# Patient Record
Sex: Male | Born: 1985 | Race: Black or African American | Hispanic: No | Marital: Single | Smoking: Current every day smoker
Health system: Southern US, Community
[De-identification: ages and names within clinical notes are randomized; demographics above are authoritative.]

---

## 2014-11-03 ENCOUNTER — Ambulatory Visit (INDEPENDENT_AMBULATORY_CARE_PROVIDER_SITE_OTHER): Payer: 59 | Admitting: Emergency Medicine

## 2014-11-03 VITALS — BP 112/80 | HR 55 | Temp 98.1°F | Resp 16 | Ht 70.0 in | Wt 142.2 lb

## 2014-11-03 DIAGNOSIS — J209 Acute bronchitis, unspecified: Secondary | ICD-10-CM

## 2014-11-03 DIAGNOSIS — J0140 Acute pansinusitis, unspecified: Secondary | ICD-10-CM

## 2014-11-03 MED ORDER — PSEUDOEPHEDRINE-GUAIFENESIN ER 60-600 MG PO TB12: 1.0000 | ORAL_TABLET | Freq: Two times a day (BID) | ORAL | Status: AC

## 2014-11-03 MED ORDER — PROMETHAZINE-CODEINE 6.25-10 MG/5ML PO SYRP: 5.0000 mL | ORAL_SOLUTION | Freq: Four times a day (QID) | ORAL | Status: DC | PRN

## 2014-11-03 MED ORDER — AMOXICILLIN-POT CLAVULANATE 875-125 MG PO TABS: 1.0000 | ORAL_TABLET | Freq: Two times a day (BID) | ORAL | Status: DC

## 2014-11-03 NOTE — Progress Notes (Signed)
[facility] [address], [place] [id number] [phone]  Date:  11/03/2014   Name:  Bruce MunroMohamed Almanza   DOB:  [birth year]   MRN:  [id number]  PCP:  No primary care provider on file.    Chief Complaint: Headache; Cough; and Sinusitis   History of Present Illness:  Bruce Flores is a 28 y.o. very pleasant male patient who presents with the following:  Ill for a week with nasal congestion and frontal headache.  Has no energy Has a cough productive of purulent sputum  No wheezing or shortness of breath No fever but chilled Sore throat No nausea or vomiting.  No stool change No improvement with over the counter medications or other home remedies.  Denies other complaint or health concern today.   There are no active problems to display for this patient.   History reviewed. No pertinent past medical history.  History reviewed. No pertinent past surgical history.  History  Substance Use Topics  . Smoking status: Current Every Day Smoker  . Smokeless tobacco: Not on file  . Alcohol Use: 0.0 oz/week    0 Not specified per week    History reviewed. No pertinent family history.  No Known Allergies  Medication list has been reviewed and updated.  No current outpatient prescriptions on file prior to visit.   No current facility-administered medications on file prior to visit.    Review of Systems:  As per HPI, otherwise negative.    Physical Examination: Filed Vitals:   11/03/14 1342  BP: 112/80  Pulse: 55  Temp: 98.1 F (36.7 C)  Resp: 16   Filed Vitals:   11/03/14 1342  Height: 5\' 10"  (1.778 m)  Weight: 142 lb 3.2 oz (64.501 kg)   Body mass index is 20.4 kg/(m^2). Ideal Body Weight: Weight in (lb) to have BMI = 25: 173.9  GEN: WDWN, NAD, Non-toxic, A & O x 3 HEENT: Atraumatic, Normocephalic. Neck supple. No masses, No LAD. Ears and Nose: No external deformity. CV: RRR, No M/G/R. No JVD. No thrill. No extra heart sounds. PULM:  CTA B, no wheezes, crackles, rhonchi. No retractions. No resp. distress. No accessory muscle use. ABD: S, NT, ND, +BS. No rebound. No HSM. EXTR: No c/c/e NEURO Normal gait.  PSYCH: Normally interactive. Conversant. Not depressed or anxious appearing.  Calm demeanor.    Assessment and Plan: Bronchitis Sinusitis  augmentin mucinex  Phen c cod  Signed,  Phillips OdorJeffery Bryttany Tortorelli, MD

## 2014-11-03 NOTE — Patient Instructions (Signed)
bAcute Bronchitis Bronchitis is inflammation of the airways that extend from the windpipe into the lungs (bronchi). The inflammation often causes mucus to develop. This leads to a cough, which is the most common symptom of bronchitis.  In acute bronchitis, the condition usually develops suddenly and goes away over time, usually in a couple weeks. Smoking, allergies, and asthma can make bronchitis worse. Repeated episodes of bronchitis may cause further lung problems.  CAUSES Acute bronchitis is most often caused by the same virus that causes a cold. The virus can spread from person to person (contagious) through coughing, sneezing, and touching contaminated objects. SIGNS AND SYMPTOMS   Cough.   Fever.   Coughing up mucus.   Body aches.   Chest congestion.   Chills.   Shortness of breath.   Sore throat.  DIAGNOSIS  Acute bronchitis is usually diagnosed through a physical exam. Your health care provider will also ask you questions about your medical history. Tests, such as chest X-rays, are sometimes done to rule out other conditions.  TREATMENT  Acute bronchitis usually goes away in a couple weeks. Oftentimes, no medical treatment is necessary. Medicines are sometimes given for relief of fever or cough. Antibiotic medicines are usually not needed but may be prescribed in certain situations. In some cases, an inhaler may be recommended to help reduce shortness of breath and control the cough. A cool mist vaporizer may also be used to help thin bronchial secretions and make it easier to clear the chest.  HOME CARE INSTRUCTIONS  Get plenty of rest.   Drink enough fluids to keep your urine clear or pale yellow (unless you have a medical condition that requires fluid restriction). Increasing fluids may help thin your respiratory secretions (sputum) and reduce chest congestion, and it will prevent dehydration.   Take medicines only as directed by your health care provider.  If  you were prescribed an antibiotic medicine, finish it all even if you start to feel better.  Avoid smoking and secondhand smoke. Exposure to cigarette smoke or irritating chemicals will make bronchitis worse. If you are a smoker, consider using nicotine gum or skin patches to help control withdrawal symptoms. Quitting smoking will help your lungs heal faster.   Reduce the chances of another bout of acute bronchitis by washing your hands frequently, avoiding people with cold symptoms, and trying not to touch your hands to your mouth, nose, or eyes.   Keep all follow-up visits as directed by your health care provider.  SEEK MEDICAL CARE IF: Your symptoms do not improve after 1 week of treatment.  SEEK IMMEDIATE MEDICAL CARE IF:  You develop an increased fever or chills.   You have chest pain.   You have severe shortness of breath.  You have bloody sputum.   You develop dehydration.  You faint or repeatedly feel like you are going to pass out.  You develop repeated vomiting.  You develop a severe headache. MAKE SURE YOU:   Understand these instructions.  Will watch your condition.  Will get help right away if you are not doing well or get worse. Document Released: 12/19/2004 Document Revised: 03/28/2014 Document Reviewed: 05/04/2013 [facility] Patient Information 2015 [place]. This information is not intended to replace advice given to you by your health care provider. Make sure you discuss any questions you have with your health care provider. Sinusitis Sinusitis is redness, soreness, and inflammation of the paranasal sinuses. Paranasal sinuses are air pockets within the bones of your face (beneath the  eyes, the middle of the forehead, or above the eyes). In healthy paranasal sinuses, mucus is able to drain out, and air is able to circulate through them by way of your nose. However, when your paranasal sinuses are inflamed, mucus and air can become trapped. This can  allow bacteria and other germs to grow and cause infection. Sinusitis can develop quickly and last only a short time (acute) or continue over a long period (chronic). Sinusitis that lasts for more than 12 weeks is considered chronic.  CAUSES  Causes of sinusitis include:  Allergies.  Structural abnormalities, such as displacement of the cartilage that separates your nostrils (deviated septum), which can decrease the air flow through your nose and sinuses and affect sinus drainage.  Functional abnormalities, such as when the small hairs (cilia) that line your sinuses and help remove mucus do not work properly or are not present. SIGNS AND SYMPTOMS  Symptoms of acute and chronic sinusitis are the same. The primary symptoms are pain and pressure around the affected sinuses. Other symptoms include:  Upper toothache.  Earache.  Headache.  Bad breath.  Decreased sense of smell and taste.  A cough, which worsens when you are lying flat.  Fatigue.  Fever.  Thick drainage from your nose, which often is green and may contain pus (purulent).  Swelling and warmth over the affected sinuses. DIAGNOSIS  Your health care provider will perform a physical exam. During the exam, your health care provider may:  Look in your nose for signs of abnormal growths in your nostrils (nasal polyps).  Tap over the affected sinus to check for signs of infection.  View the inside of your sinuses (endoscopy) using an imaging device that has a light attached (endoscope). If your health care provider suspects that you have chronic sinusitis, one or more of the following tests may be recommended:  Allergy tests.  Nasal culture. A sample of mucus is taken from your nose, sent to a lab, and screened for bacteria.  Nasal cytology. A sample of mucus is taken from your nose and examined by your health care provider to determine if your sinusitis is related to an allergy. TREATMENT  Most cases of acute  sinusitis are related to a viral infection and will resolve on their own within 10 days. Sometimes medicines are prescribed to help relieve symptoms (pain medicine, decongestants, nasal steroid sprays, or saline sprays).  However, for sinusitis related to a bacterial infection, your health care provider will prescribe antibiotic medicines. These are medicines that will help kill the bacteria causing the infection.  Rarely, sinusitis is caused by a fungal infection. In theses cases, your health care provider will prescribe antifungal medicine. For some cases of chronic sinusitis, surgery is needed. Generally, these are cases in which sinusitis recurs more than 3 times per year, despite other treatments. HOME CARE INSTRUCTIONS   Drink plenty of water. Water helps thin the mucus so your sinuses can drain more easily.  Use a humidifier.  Inhale steam 3 to 4 times a day (for example, sit in the bathroom with the shower running).  Apply a warm, moist washcloth to your face 3 to 4 times a day, or as directed by your health care provider.  Use saline nasal sprays to help moisten and clean your sinuses.  Take medicines only as directed by your health care provider.  If you were prescribed either an antibiotic or antifungal medicine, finish it all even if you start to feel better. SEEK IMMEDIATE MEDICAL   CARE IF:  You have increasing pain or severe headaches.  You have nausea, vomiting, or drowsiness.  You have swelling around your face.  You have vision problems.  You have a stiff neck.  You have difficulty breathing. MAKE SURE YOU:   Understand these instructions.  Will watch your condition.  Will get help right away if you are not doing well or get worse. Document Released: 11/11/2005 Document Revised: 03/28/2014 Document Reviewed: 11/26/2011 [employer] Patient Information 2015 [employer]. This information is not intended to replace advice given to you by your health care provider.  Make sure you discuss any questions you have with your health care provider.  

## 2017-07-09 ENCOUNTER — Encounter (HOSPITAL_COMMUNITY): Payer: Self-pay | Admitting: Emergency Medicine

## 2017-07-09 ENCOUNTER — Emergency Department (HOSPITAL_COMMUNITY)
Admission: EM | Admit: 2017-07-09 | Discharge: 2017-07-09 | Disposition: A | Discharge status: Home / Self Care | Payer: BLUE CROSS/BLUE SHIELD | Attending: Emergency Medicine | Admitting: Emergency Medicine

## 2017-07-09 DIAGNOSIS — F17200 Nicotine dependence, unspecified, uncomplicated: Secondary | ICD-10-CM | POA: Insufficient documentation

## 2017-07-09 DIAGNOSIS — Z79899 Other long term (current) drug therapy: Secondary | ICD-10-CM | POA: Insufficient documentation

## 2017-07-09 DIAGNOSIS — R369 Urethral discharge, unspecified: Secondary | ICD-10-CM | POA: Insufficient documentation

## 2017-07-09 LAB — URINALYSIS, ROUTINE W REFLEX MICROSCOPIC
BILIRUBIN URINE: NEGATIVE
GLUCOSE, UA: NEGATIVE mg/dL
Ketones, ur: NEGATIVE mg/dL
NITRITE: NEGATIVE
Protein, ur: NEGATIVE mg/dL
RBC / HPF: NONE SEEN RBC/hpf (ref 0–5)
SPECIFIC GRAVITY, URINE: 1.004 — AB (ref 1.005–1.030)
pH: 6 (ref 5.0–8.0)

## 2017-07-09 LAB — GC/CHLAMYDIA PROBE AMP (~~LOC~~) NOT AT ARMC
CHLAMYDIA, DNA PROBE: NEGATIVE
Neisseria Gonorrhea: NEGATIVE

## 2017-07-09 MED ORDER — AZITHROMYCIN 250 MG PO TABS
1000.0000 mg | ORAL_TABLET | Freq: Once | ORAL | Status: AC
  Administered 2017-07-09: 1000 mg via ORAL
  Filled 2017-07-09: qty 4

## 2017-07-09 MED ORDER — CEFTRIAXONE SODIUM 250 MG IJ SOLR
250.0000 mg | Freq: Once | INTRAMUSCULAR | Status: AC
  Administered 2017-07-09: 250 mg via INTRAMUSCULAR
  Filled 2017-07-09: qty 250

## 2017-07-09 MED ORDER — LIDOCAINE HCL (PF) 1 % IJ SOLN
INTRAMUSCULAR | Status: AC
  Filled 2017-07-09: qty 5

## 2017-07-09 MED ORDER — LIDOCAINE HCL (PF) 1 % IJ SOLN
0.9000 mL | Freq: Once | INTRAMUSCULAR | Status: AC
  Administered 2017-07-09: 0.9 mL

## 2017-07-09 NOTE — ED Triage Notes (Signed)
Patient reports penile discharge with mild dysuria onset last week , denies fever or chills .

## 2017-07-09 NOTE — ED Provider Notes (Signed)
[facility]-EMERGENCY DEPT Provider Note   CSN: [id number] Arrival date & time: 07/09/17  0040     History   Chief Complaint Chief Complaint  Patient presents with  . Penile Discharge    HPI Bruce Flores is a 31 y.o. male.  Patient presents to the ED with a chief complaint of penile discharge.  He states that the symptoms started 1 week ago.  He states that he has had some yellow discharge, but minimal dysuria.  States that he has multiple partners, but always uses a condom.  He has tried treating himself with garlic and ginger.  He denies any testicle or abdominal pain.   The history is provided by the patient. No language interpreter was used.    History reviewed. No pertinent past medical history.  There are no active problems to display for this patient.   History reviewed. No pertinent surgical history.     Home Medications    Prior to Admission medications   Medication Sig Start Date End Date Taking? Authorizing Provider  amoxicillin-clavulanate (AUGMENTIN) 875-125 MG per tablet Take 1 tablet by mouth 2 (two) times daily. 11/03/14   Carmelina Dane, MD  promethazine-codeine [facility] WITH CODEINE) 6.25-10 MG/5ML syrup Take 5-10 mLs by mouth every 6 (six) hours as needed. 11/03/14   Carmelina Dane, MD    Family History No family history on file.  Social History Social History  Substance Use Topics  . Smoking status: Current Every Day Smoker  . Smokeless tobacco: Never Used  . Alcohol use 0.0 oz/week     Allergies   Patient has no known allergies.   Review of Systems Review of Systems  Constitutional: Negative for chills and fever.  Respiratory: Negative for shortness of breath.   Cardiovascular: Negative for chest pain.  Gastrointestinal: Negative for constipation, diarrhea, nausea and vomiting.  Genitourinary: Positive for discharge. Negative for dysuria.     Physical Exam Updated Vital Signs BP (!) 128/98 (BP Location: Left Arm)    Pulse 61   Temp 98.4 F (36.9 C) (Oral)   Resp 16   SpO2 100%   Physical Exam  Constitutional: He is oriented to person, place, and time. He appears well-developed and well-nourished.  HENT:  Head: Normocephalic and atraumatic.  Eyes: Conjunctivae and EOM are normal.  Neck: Normal range of motion.  Cardiovascular: Normal rate.   Pulmonary/Chest: Effort normal.  Abdominal: He exhibits no distension.  Genitourinary:  Genitourinary Comments: Circumcised Minimal discharge No testicle tenderness  Musculoskeletal: Normal range of motion.  Neurological: He is alert and oriented to person, place, and time.  Skin: Skin is dry.  Psychiatric: He has a normal mood and affect. His behavior is normal. Judgment and thought content normal.  Nursing note and vitals reviewed.    ED Treatments / Results  Labs (all labs ordered are listed, but only abnormal results are displayed) Labs Reviewed  URINALYSIS, ROUTINE W REFLEX MICROSCOPIC - Abnormal; Notable for the following:       Result Value   Color, Urine COLORLESS (*)    Specific Gravity, Urine 1.004 (*)    Hgb urine dipstick SMALL (*)    Leukocytes, UA LARGE (*)    Bacteria, UA RARE (*)    Squamous Epithelial / LPF 0-5 (*)    All other components within normal limits  GC/CHLAMYDIA PROBE AMP ([place]) NOT AT [facility]    EKG  EKG Interpretation None       Radiology No results found.  Procedures Procedures (including  critical care time)  Medications Ordered in ED Medications  cefTRIAXone (ROCEPHIN) injection 250 mg (not administered)  azithromycin (ZITHROMAX) tablet 1,000 mg (not administered)     Initial Impression / Assessment and Plan / ED Course  I have reviewed the triage vital signs and the nursing notes.  Pertinent labs & imaging results that were available during my care of the patient were reviewed by me and considered in my medical decision making (see chart for details).       Final Clinical  Impressions(s) / ED Diagnoses   Final diagnoses:  Penile discharge    New Prescriptions New Prescriptions   No medications on file     Roxy HorsemanBrowning, Taite Baldassari, Cordelia Poche-C 07/09/17 [phone]    Zadie RhineWickline, Donald, MD 07/09/17 [phone]

## 2018-03-05 ENCOUNTER — Encounter (HOSPITAL_COMMUNITY): Payer: Self-pay | Admitting: Emergency Medicine

## 2018-03-05 ENCOUNTER — Emergency Department (HOSPITAL_COMMUNITY)
Admission: EM | Admit: 2018-03-05 | Discharge: 2018-03-06 | Disposition: A | Discharge status: Home / Self Care | Payer: BLUE CROSS/BLUE SHIELD | Attending: Emergency Medicine | Admitting: Emergency Medicine

## 2018-03-05 ENCOUNTER — Other Ambulatory Visit: Payer: Self-pay

## 2018-03-05 DIAGNOSIS — Z79899 Other long term (current) drug therapy: Secondary | ICD-10-CM | POA: Diagnosis not present

## 2018-03-05 DIAGNOSIS — R519 Headache, unspecified: Secondary | ICD-10-CM

## 2018-03-05 DIAGNOSIS — F17210 Nicotine dependence, cigarettes, uncomplicated: Secondary | ICD-10-CM | POA: Insufficient documentation

## 2018-03-05 DIAGNOSIS — R109 Unspecified abdominal pain: Secondary | ICD-10-CM | POA: Insufficient documentation

## 2018-03-05 DIAGNOSIS — R51 Headache: Secondary | ICD-10-CM | POA: Diagnosis not present

## 2018-03-05 LAB — COMPREHENSIVE METABOLIC PANEL
ALBUMIN: 4.2 g/dL (ref 3.5–5.0)
ALT: 12 U/L — ABNORMAL LOW (ref 17–63)
ANION GAP: 10 (ref 5–15)
AST: 19 U/L (ref 15–41)
Alkaline Phosphatase: 75 U/L (ref 38–126)
BILIRUBIN TOTAL: 1 mg/dL (ref 0.3–1.2)
BUN: 18 mg/dL (ref 6–20)
CHLORIDE: 104 mmol/L (ref 101–111)
CO2: 21 mmol/L — AB (ref 22–32)
Calcium: 9.2 mg/dL (ref 8.9–10.3)
Creatinine, Ser: 0.9 mg/dL (ref 0.61–1.24)
GFR calc Af Amer: >60 mL/min (ref >60)
GFR calc non Af Amer: >60 mL/min (ref >60)
GLUCOSE: 83 mg/dL (ref 65–99)
POTASSIUM: 3.7 mmol/L (ref 3.5–5.1)
SODIUM: 135 mmol/L (ref 135–145)
Total Protein: 7 g/dL (ref 6.5–8.1)

## 2018-03-05 LAB — URINALYSIS, ROUTINE W REFLEX MICROSCOPIC
BACTERIA UA: NONE SEEN
Bilirubin Urine: NEGATIVE
Glucose, UA: NEGATIVE mg/dL
KETONES UR: 5 mg/dL — AB
LEUKOCYTES UA: NEGATIVE
Nitrite: NEGATIVE
PROTEIN: NEGATIVE mg/dL
SQUAMOUS EPITHELIAL / LPF: NONE SEEN
Specific Gravity, Urine: 1.014 (ref 1.005–1.030)
pH: 6 (ref 5.0–8.0)

## 2018-03-05 LAB — CBC WITH DIFFERENTIAL/PLATELET
BASOS ABS: 0.1 10*3/uL (ref 0.0–0.1)
BASOS PCT: 1 %
EOS ABS: 0.2 10*3/uL (ref 0.0–0.7)
Eosinophils Relative: 5 %
HEMATOCRIT: 43.5 % (ref 39.0–52.0)
Hemoglobin: 15 g/dL (ref 13.0–17.0)
Lymphocytes Relative: 32 %
Lymphs Abs: 1.5 10*3/uL (ref 0.7–4.0)
MCH: 28.5 pg (ref 26.0–34.0)
MCHC: 34.5 g/dL (ref 30.0–36.0)
MCV: 82.7 fL (ref 78.0–100.0)
Monocytes Absolute: 0.3 10*3/uL (ref 0.1–1.0)
Monocytes Relative: 7 %
NEUTROS ABS: 2.6 10*3/uL (ref 1.7–7.7)
NEUTROS PCT: 55 %
Platelets: 268 10*3/uL (ref 150–400)
RBC: 5.26 MIL/uL (ref 4.22–5.81)
RDW: 12.4 % (ref 11.5–15.5)
WBC: 4.6 10*3/uL (ref 4.0–10.5)

## 2018-03-05 NOTE — ED Triage Notes (Addendum)
Pt to ED with multiple complaints.  Pt c/o pain in right abd. And flank pain.  Was seen at urology office yesterday for same.  Pt also c/o headache, st's he is scheduled for a CT next Tues but st's he can't wait that long.  [facility]. Also st's his left eye feels heavy.   Symptoms started in Feb

## 2018-03-06 ENCOUNTER — Emergency Department (HOSPITAL_COMMUNITY): Payer: BLUE CROSS/BLUE SHIELD

## 2018-03-06 LAB — TSH: TSH: 1.716 u[IU]/mL (ref 0.350–4.500)

## 2018-03-06 NOTE — ED Notes (Signed)
Patient transported to CT scan . 

## 2018-03-06 NOTE — Discharge Instructions (Addendum)
Ibuprofen 600 mg every 6 hours as needed for pain.  Follow-up with a primary doctor if symptoms are not improving in the next week, and return to the ER if symptoms significantly worsen or change.

## 2018-03-06 NOTE — ED Provider Notes (Signed)
[facility] EMERGENCY DEPARTMENT Provider Note   CSN: [id number] Arrival date & time: 03/05/18  2113     History   Chief Complaint Chief Complaint  Patient presents with  . Abdominal Pain  . Headache  . Flank Pain    HPI Treyton Slimp is a 32 y.o. male.  Patient is a 32 year old male with no significant past medical history.  He presents today for evaluation of a 44-month history of intermittent pain in his left flank.  He also reports intermittent headaches during this period of time.  He has been seen at urgent care and is scheduled to have CT scans.  He presents tonight stating that his symptoms are worsening and he cannot wait to have the scheduled as an outpatient.  He denies any visual disturbances.  He denies any fevers or chills.  He denies any dysuria.  He tells me he was treated for a possible urinary tract infection, but finished his antibiotics several weeks ago.  This did not relieve his symptoms.  The history is provided by the patient.  Abdominal Pain   This is a new problem. Episode onset: 2 months ago. Episode frequency: Intermittently. The problem has been gradually worsening. Pain location: Left flank. The quality of the pain is cramping. The pain is moderate. Pertinent negatives include hematochezia. Nothing aggravates the symptoms. Nothing relieves the symptoms.    History reviewed. No pertinent past medical history.  There are no active problems to display for this patient.   History reviewed. No pertinent surgical history.      Home Medications    Prior to Admission medications   Medication Sig Start Date End Date Taking? Authorizing Provider  amoxicillin-clavulanate (AUGMENTIN) 875-125 MG per tablet Take 1 tablet by mouth 2 (two) times daily. 11/03/14   Carmelina Dane, MD  promethazine-codeine [facility] WITH CODEINE) 6.25-10 MG/5ML syrup Take 5-10 mLs by mouth every 6 (six) hours as needed. 11/03/14   Carmelina Dane, MD      Family History No family history on file.  Social History Social History   Tobacco Use  . Smoking status: Current Every Day Smoker  . Smokeless tobacco: Never Used  Substance Use Topics  . Alcohol use: Yes    Alcohol/week: 0.0 oz  . Drug use: No     Allergies   Patient has no known allergies.   Review of Systems Review of Systems  Gastrointestinal: Negative for hematochezia.  All other systems reviewed and are negative.    Physical Exam Updated Vital Signs BP 126/74 (BP Location: Right Arm)   Pulse (!) 58   Temp 98.3 F (36.8 C) (Oral)   Resp 16   Ht 6' (1.829 m)   Wt 66.2 kg (146 lb)   SpO2 99%   BMI 19.80 kg/m   Physical Exam  Constitutional: He is oriented to person, place, and time. He appears well-developed and well-nourished. No distress.  HENT:  Head: Normocephalic and atraumatic.  Mouth/Throat: Oropharynx is clear and moist.  Neck: Normal range of motion. Neck supple.  Cardiovascular: Normal rate and regular rhythm. Exam reveals no friction rub.  No murmur heard. Pulmonary/Chest: Effort normal and breath sounds normal. No respiratory distress. He has no wheezes. He has no rales.  Abdominal: Soft. Bowel sounds are normal. He exhibits no distension. There is no tenderness.  Sided CVA tenderness noted.  Musculoskeletal: Normal range of motion. He exhibits no edema.  Neurological: He is alert and oriented to person, place, and time. Coordination  normal.  Skin: Skin is warm and dry. He is not diaphoretic.  Nursing note and vitals reviewed.    ED Treatments / Results  Labs (all labs ordered are listed, but only abnormal results are displayed) Labs Reviewed  COMPREHENSIVE METABOLIC PANEL - Abnormal; Notable for the following components:      Result Value   CO2 21 (*)    ALT 12 (*)    All other components within normal limits  URINALYSIS, ROUTINE W REFLEX MICROSCOPIC - Abnormal; Notable for the following components:   Hgb urine dipstick  MODERATE (*)    Ketones, ur 5 (*)    All other components within normal limits  CBC WITH DIFFERENTIAL/PLATELET  TSH    EKG None  Radiology No results found.  Procedures Procedures (including critical care time)  Medications Ordered in ED Medications - No data to display   Initial Impression / Assessment and Plan / ED Course  I have reviewed the triage vital signs and the nursing notes.  Pertinent labs & imaging results that were available during my care of the patient were reviewed by me and considered in my medical decision making (see chart for details).  This patient presents with multiple complaints including headache and flank pain.  He also reports intermittent cold sensation to his right hand and right foot.  He has been seated at urgent care, however no cause was found.  His workup today is essentially unremarkable.  His laboratory studies are essentially normal.  TSH is in the normal range.  CT scan of the head and the abdomen/pelvis both failed to reveal any acute pathology.  At this point I believe patient is appropriate for discharge.  He is to follow-up with a primary doctor if symptoms persist.  Final Clinical Impressions(s) / ED Diagnoses   Final diagnoses:  None    ED Discharge Orders    None       Geoffery Lyonselo, Bevan Disney, MD 03/06/18 [phone]

## 2018-03-06 NOTE — ED Notes (Signed)
MD at bedside at this time.

## 2018-03-13 NOTE — Progress Notes (Signed)
Subjective:    Patient ID: Bruce Flores, male    DOB: 25-Aug-1986, 32 y.o.   MRN: [id number]  Chief Complaint  Patient presents with  . Follow-up    HPI:  Bruce Flores is a 32 y.o. male who presents today for initial evaluation and treatment for testicular pain.   Bruce Flores was first seen in August 2018 in the emergency department with approximately one week of yellowed penile discharge and minimal dysuria. Gonorrhea/chlamydia at the time were negative. He was treated with ceftriaxone and azithromycin.   He was seen by urology on 4/10 with reports of frequent urination, nocturia, leakage of urine, and straining to urinate. Urine dipstick was positive for blood and negative for nitrates and leukocytes. He was diagnosed with periumbilical pain and microscopic hematuria of undetermined cause. He is scheduled for a CT hematuria study because of blood on the dip and abdominal pain.  He was most recently seen in the ED on 4/12 with multiple complaints including headache and abdominal pain as he could not wait for the CT secondary to worsening symptoms. Per ED notes he was also seen in urgent care on several occasions and treated with antibiotics that did not relieve his symptoms. No imaging/labwork is available for review from these office visits. 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 He was discharged with instructions to follow-up with primary care. He has been referred to ID secondary to concern for leukopenia and infection. All available notes, records, and imaging were reviewed in detail.  Continues to experience the associated symptoms of pain located in his left testicle that has been going on for about 8 months. Pain is described as feeling like  someone hit it. Modifying factors include ice, previous ceftriaxone injections which have not helped, and azithromycin. For the last 4 days he has noted that after he finishing urinating he notes a small discharge colored white and like a glue. Denies any recent sexual activity. No fevers, chills, night sweats. Denies abdominal pain, nausea, vomiting or diarrhea. No trauma or injury. He currently wears boxers.     No Known Allergies    Outpatient Medications Prior to Visit  Medication Sig Dispense Refill  . amoxicillin-clavulanate (AUGMENTIN) 875-125 MG per tablet Take 1 tablet by mouth 2 (two) times daily. (Patient not taking: Reported on 03/16/2018) 20 tablet 0  . promethazine-codeine (PHENERGAN WITH CODEINE) 6.25-10 MG/5ML syrup Take 5-10 mLs by mouth every 6 (six) hours as needed. (Patient not taking: Reported on 03/16/2018) 120 mL 0   No facility-administered medications prior to visit.      History reviewed. No pertinent past medical history.    History reviewed. No pertinent surgical history.    History reviewed. No pertinent family history.    Social History   Socioeconomic History  . Marital status: Single    Spouse name: Not on file  . Number of children: Not on file  . Years of education: Not on file  . Highest education level: Not on file  Occupational History  . Not on file  Social Needs  . Financial resource strain: Not on file  . Food insecurity:    Worry: Not on file    Inability: Not on file  . Transportation needs:    Medical: Not on file  Non-medical: Not on file  Tobacco Use  . Smoking status: Current Every Day Smoker  . Smokeless tobacco: Never Used  Substance and Sexual Activity  . Alcohol use: Yes    Alcohol/week: 0.0 oz  . Drug use: No  . Sexual activity: Not on file  Lifestyle  . Physical activity:    Days per week: Not on file    Minutes per session: Not on file  . Stress: Not on file  Relationships  . Social connections:     Talks on phone: Not on file    Gets together: Not on file    Attends religious service: Not on file    Active member of club or organization: Not on file    Attends meetings of clubs or organizations: Not on file    Relationship status: Not on file  . Intimate partner violence:    Fear of current or ex partner: Not on file    Emotionally abused: Not on file    Physically abused: Not on file    Forced sexual activity: Not on file  Other Topics Concern  . Not on file  Social History Narrative  . Not on file      Review of Systems  Constitutional: Negative for chills and fever.  Respiratory: Negative for cough, chest tightness, shortness of breath and wheezing.   Cardiovascular: Negative for chest pain.  Genitourinary: Positive for scrotal swelling and testicular pain. Negative for difficulty urinating, flank pain, genital sores, hematuria, penile pain and urgency.       Objective:    BP 117/81   Pulse 64   Temp 98.3 F (36.8 C) (Oral)   Wt 146 lb (66.2 kg)   BMI 19.80 kg/m  Nursing note and vital signs reviewed.  Physical Exam  Constitutional: He is oriented to person, place, and time. He appears well-developed and well-nourished. No distress.  Cardiovascular: Normal rate, regular rhythm, normal heart sounds and intact distal pulses.  Pulmonary/Chest: Effort normal and breath sounds normal.  Abdominal: Hernia confirmed negative in the left inguinal area.  Genitourinary: Right testis shows no mass, no swelling and no tenderness. Right testis is descended. Cremasteric reflex is not absent on the right side. Left testis shows swelling and tenderness. Left testis shows no mass. Circumcised. No penile erythema or penile tenderness. No discharge found.  Lymphadenopathy: No inguinal adenopathy noted on the left side.  Neurological: He is alert and oriented to person, place, and time.  Skin: Skin is warm and dry.  Psychiatric: He has a normal mood and affect. His behavior is  normal. Judgment and thought content normal.        Assessment & Plan:   Problem List Items Addressed This Visit      Other   Pain in left testicle - Primary    Bruce Flores has had chronic left testicle pain going on for 8 months and refractory to previous treatments with ceftriaxone and doxycycline. Previous UA with microscopic hematuria of uncertain origin. There are no systemic symptoms of infection and most recent WBC improved in ED blood work. Symptoms appear consistent with epididymo-orchitis of non-infectious source with malignancy not able to be excluded. I will obtain an ultrasound of the scrotum. Recommend continued follow up with urology. Plan to follow up pending ultrasound results if needed.       Relevant Orders   US SCROTUM   US SCROTUM W/DOPPLER       I have discontinued Bruce Flores's amoxicillin-clavulanate and promethazine-codeine.  Follow-up: Pending imaging results.   Jeanine LuzGregory Calone, FNP [facility]

## 2018-03-16 ENCOUNTER — Encounter: Payer: Self-pay | Admitting: Family

## 2018-03-16 ENCOUNTER — Ambulatory Visit (INDEPENDENT_AMBULATORY_CARE_PROVIDER_SITE_OTHER): Payer: BLUE CROSS/BLUE SHIELD | Admitting: Family

## 2018-03-16 VITALS — BP 117/81 | HR 64 | Temp 98.3°F | Wt 146.0 lb

## 2018-03-16 DIAGNOSIS — N50812 Left testicular pain: Secondary | ICD-10-CM

## 2018-03-16 NOTE — Assessment & Plan Note (Addendum)
Mr. Bruce Flores has had chronic left testicle pain going on for 8 months and refractory to previous treatments with ceftriaxone and doxycycline. Previous UA with microscopic hematuria of uncertain origin. There are no systemic symptoms of infection and most recent WBC improved in ED blood work. Symptoms appear consistent with epididymo-orchitis of non-infectious source with malignancy not able to be excluded. I will obtain an ultrasound of the scrotum. Recommend continued follow up with urology. Plan to follow up pending ultrasound results if needed.

## 2018-03-16 NOTE — Patient Instructions (Signed)
Nice to meet you!  It does not appear that you currently have an infection based on previous treatment with antibiotics.  We will get an ultrasound of your scrotum.  Please continue to follow up with Dr. Annabell HowellsWrenn.

## 2018-03-25 ENCOUNTER — Ambulatory Visit
Admission: RE | Admit: 2018-03-25 | Discharge: 2018-03-25 | Disposition: A | Discharge status: Home / Self Care | Payer: BLUE CROSS/BLUE SHIELD | Source: Ambulatory Visit | Attending: Family | Admitting: Family

## 2018-03-25 ENCOUNTER — Other Ambulatory Visit: Payer: BLUE CROSS/BLUE SHIELD

## 2018-03-25 DIAGNOSIS — N50812 Left testicular pain: Secondary | ICD-10-CM

## 2018-03-27 ENCOUNTER — Encounter (INDEPENDENT_AMBULATORY_CARE_PROVIDER_SITE_OTHER): Payer: Self-pay

## 2018-04-09 ENCOUNTER — Other Ambulatory Visit: Payer: Self-pay | Admitting: Family

## 2018-04-09 ENCOUNTER — Encounter: Payer: Self-pay | Admitting: Family

## 2018-04-09 MED ORDER — NAPROXEN 500 MG PO TABS: 500.0000 mg | ORAL_TABLET | Freq: Two times a day (BID) | ORAL | 0 refills | Status: AC | PRN

## 2018-07-24 ENCOUNTER — Other Ambulatory Visit: Payer: Self-pay | Admitting: Family

## 2018-09-25 ENCOUNTER — Other Ambulatory Visit: Payer: Self-pay | Admitting: Urology

## 2018-09-25 DIAGNOSIS — M545 Low back pain, unspecified: Secondary | ICD-10-CM

## 2018-09-25 DIAGNOSIS — R102 Pelvic and perineal pain: Secondary | ICD-10-CM

## 2018-09-25 DIAGNOSIS — N50812 Left testicular pain: Secondary | ICD-10-CM

## 2018-09-25 DIAGNOSIS — N50811 Right testicular pain: Secondary | ICD-10-CM

## 2018-10-01 ENCOUNTER — Other Ambulatory Visit: Payer: BLUE CROSS/BLUE SHIELD

## 2018-10-02 ENCOUNTER — Other Ambulatory Visit: Payer: BLUE CROSS/BLUE SHIELD

## 2018-10-15 ENCOUNTER — Other Ambulatory Visit: Payer: BLUE CROSS/BLUE SHIELD

## 2018-10-24 ENCOUNTER — Ambulatory Visit
Admission: RE | Admit: 2018-10-24 | Discharge: 2018-10-24 | Disposition: A | Discharge status: Home / Self Care | Payer: BLUE CROSS/BLUE SHIELD | Source: Ambulatory Visit | Attending: Urology | Admitting: Urology

## 2018-10-24 DIAGNOSIS — M545 Low back pain, unspecified: Secondary | ICD-10-CM

## 2018-10-24 DIAGNOSIS — N50812 Left testicular pain: Secondary | ICD-10-CM

## 2018-10-24 DIAGNOSIS — R102 Pelvic and perineal pain: Secondary | ICD-10-CM

## 2018-10-24 DIAGNOSIS — N50811 Right testicular pain: Secondary | ICD-10-CM

## 2019-10-23 IMAGING — CT CT RENAL STONE PROTOCOL
2 of 4 series · 16 of 46 positions shown, 18 images · non-contrast
Comparison: None.

CLINICAL DATA: Right abdominal and flank pain.

EXAM:
CT ABDOMEN AND PELVIS WITHOUT CONTRAST
TECHNIQUE: Multidetector CT imaging of the abdomen and pelvis was performed
following the standard protocol without IV contrast.

[Series 3: renal stone 5.0 · axial · 0.60mm/px · z∈[-801,-411]mm · 13 of 86 slices shown, 15 images]
[im 4/86  soft-tissue]
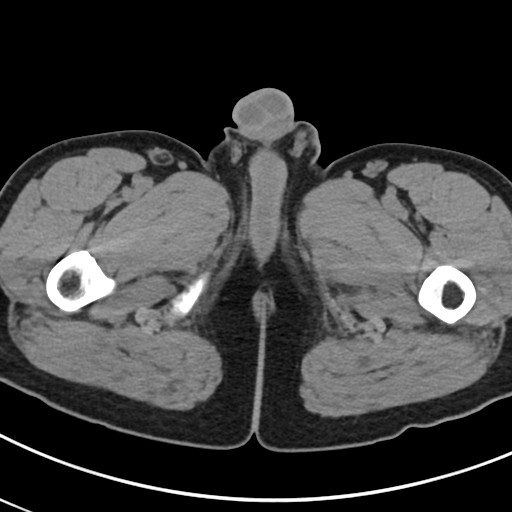
[im 4/86  bone]
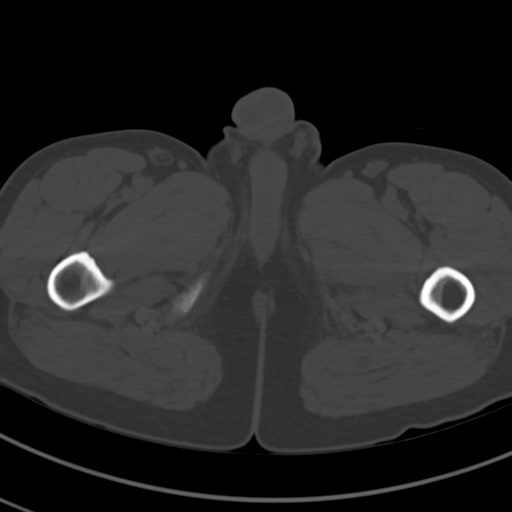
[im 11/86  soft-tissue]
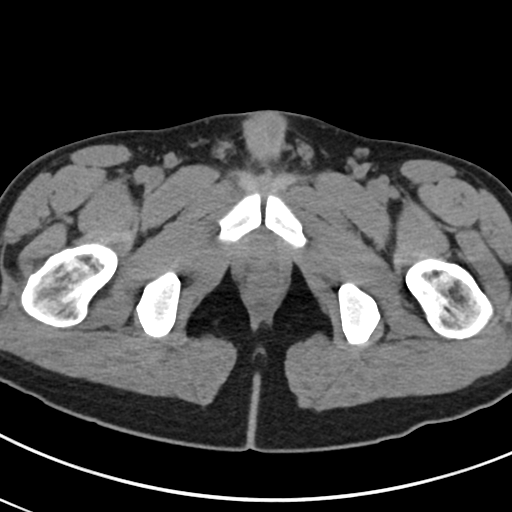
[im 18/86  soft-tissue]
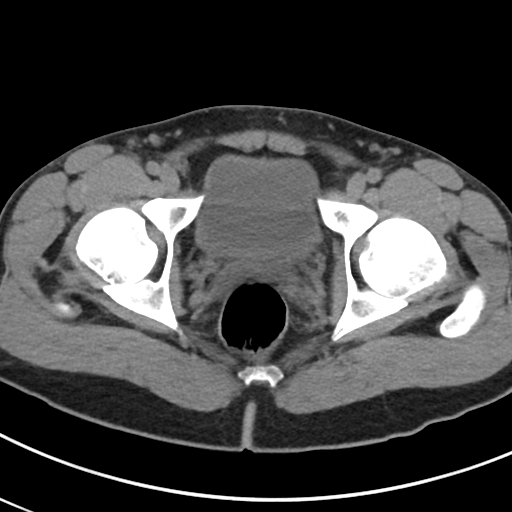
[im 24/86  soft-tissue]
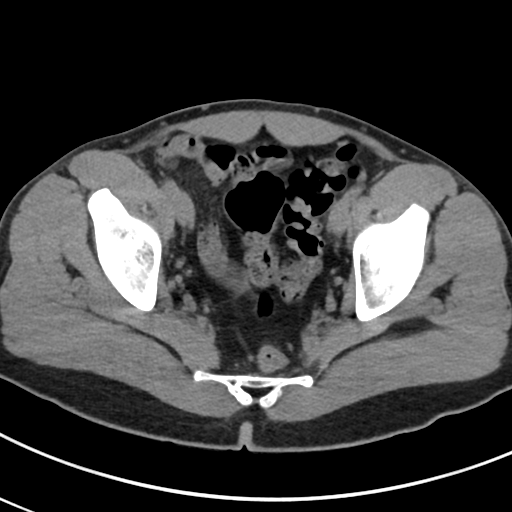
[im 31/86  soft-tissue]
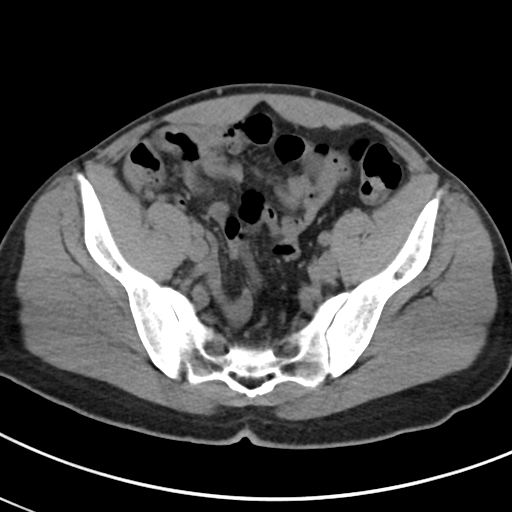
[im 38/86  soft-tissue]
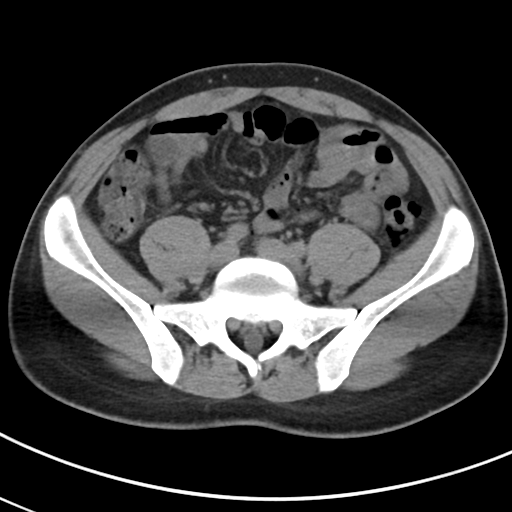
[im 45/86  soft-tissue]
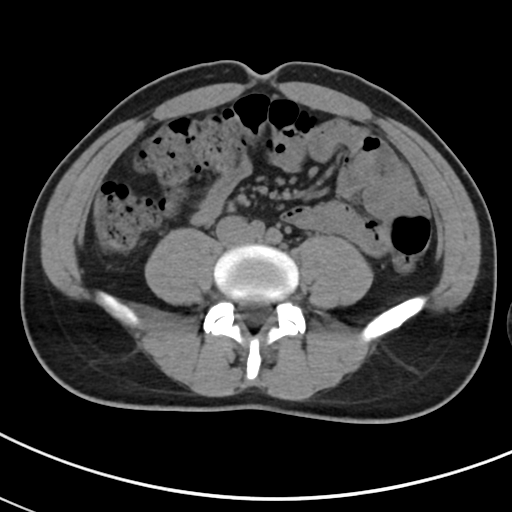
[im 48/86  soft-tissue]
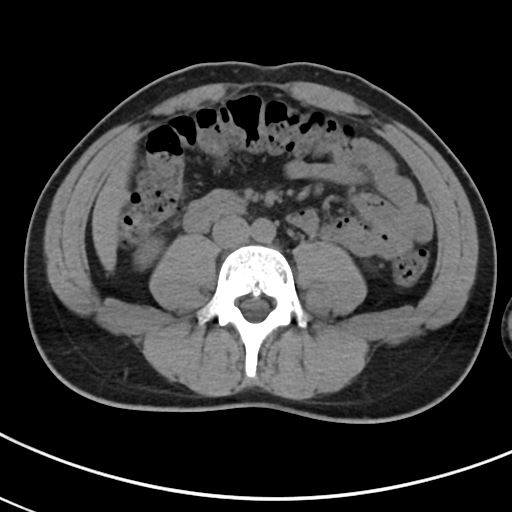
[im 55/86  soft-tissue]
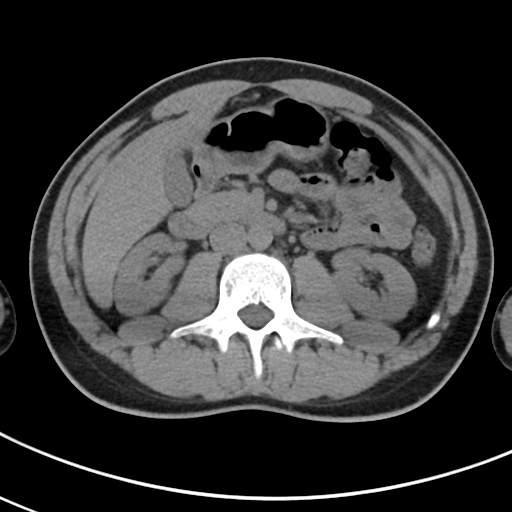
[im 55/86  bone]
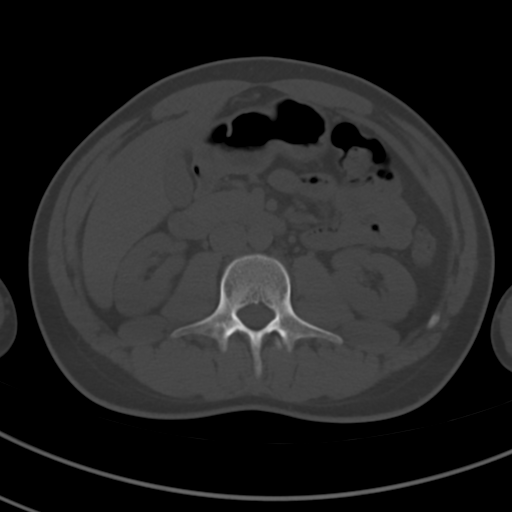
[im 62/86  soft-tissue]
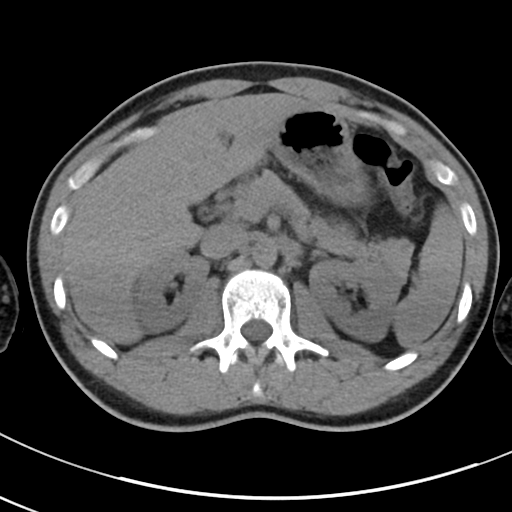
[im 69/86  soft-tissue]
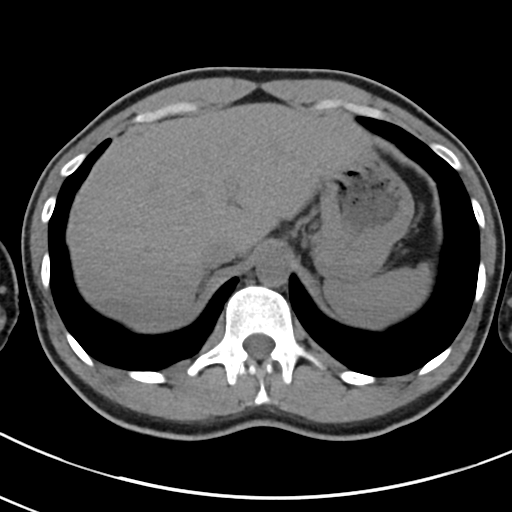
[im 75/86  soft-tissue]
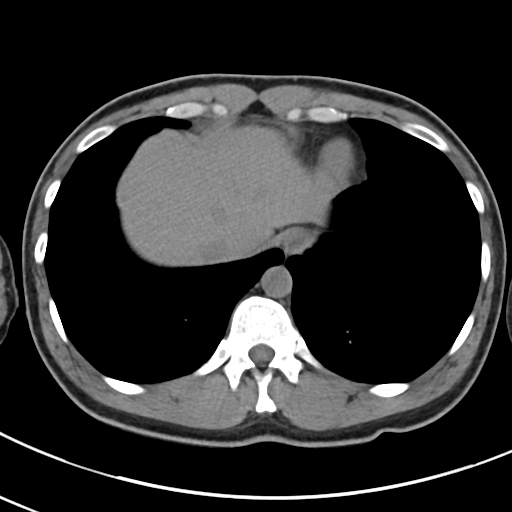
[im 82/86  soft-tissue]
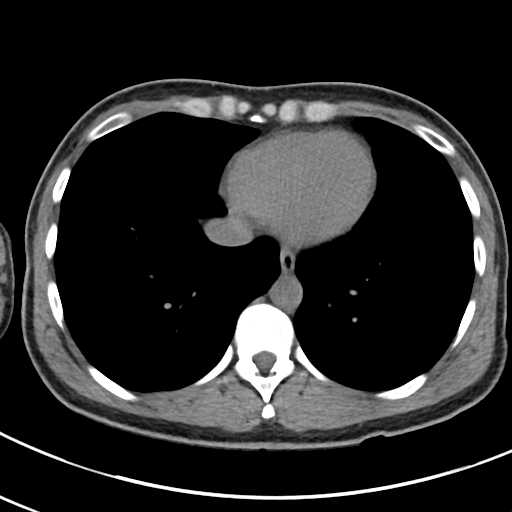

[Series 6: renal stone 3.0 cor · coronal · 0.62mm/px · 3 of 73 slices shown]
[im 25/73  soft-tissue]
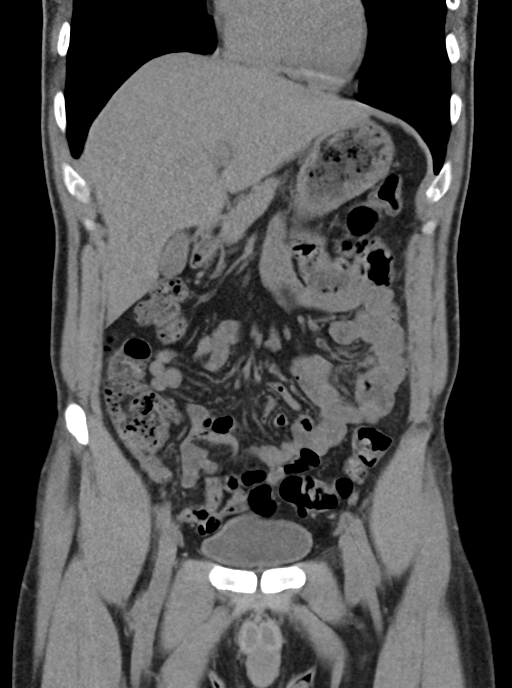
[im 33/73  soft-tissue]
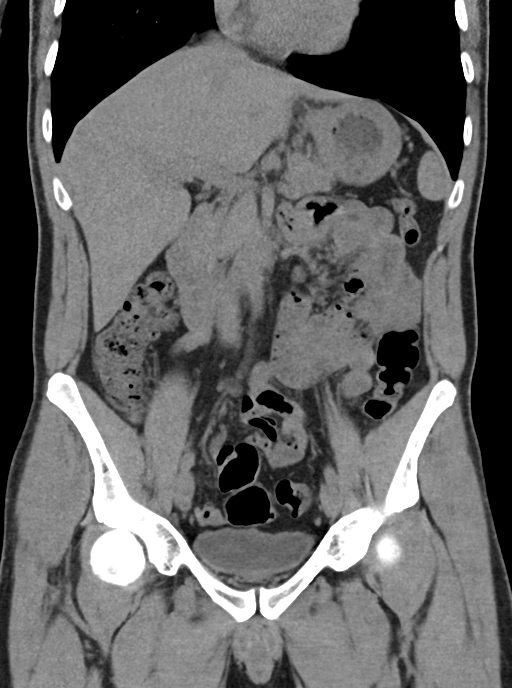
[im 41/73  soft-tissue]
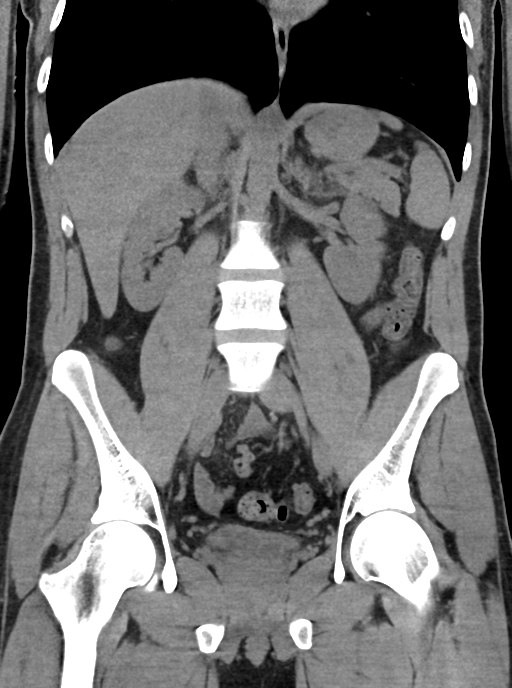

[16 of 46 positions shown; findings below may reference images not displayed]

FINDINGS: Lower chest: No acute abnormality.

Hepatobiliary: No focal liver abnormality is seen. No gallstones,
gallbladder wall thickening, or biliary dilatation.

Pancreas: Normal

Spleen: Normal

Adrenals/Urinary Tract: Normal bilateral adrenal glands. No
nephrolithiasis nor hydroureteronephrosis. The urinary bladder is
unremarkable for the degree of distention.

Stomach/Bowel: Large amount of fecal retention is seen within the
colon consistent constipation. No bowel obstruction or inflammation.
Normal-appearing appendix. Stomach is physiologically distended.

Vascular/Lymphatic: No significant vascular findings are present. No
enlarged abdominal or pelvic lymph nodes.

Reproductive: Prostate is unremarkable.

Other: No abdominal wall hernia or abnormality. No abdominopelvic
ascites.

Musculoskeletal: Unusual appearance of spinal canal and cord is
likely due to streak artifacts from patient's adjacent arms as seen
on the axial images.
IMPRESSION: No acute solid nor hollow visceral organ abnormality apart from
increased colonic stool retention. No nephrolithiasis nor
obstructive uropathy.

## 2020-10-04 DIAGNOSIS — I861 Scrotal varices: Secondary | ICD-10-CM | POA: Diagnosis not present

## 2020-10-04 DIAGNOSIS — N50812 Left testicular pain: Secondary | ICD-10-CM | POA: Diagnosis not present

## 2021-11-22 DIAGNOSIS — H1131 Conjunctival hemorrhage, right eye: Secondary | ICD-10-CM | POA: Diagnosis not present

## 2022-08-14 DIAGNOSIS — Z0000 Encounter for general adult medical examination without abnormal findings: Secondary | ICD-10-CM | POA: Diagnosis not present

## 2022-08-14 DIAGNOSIS — Z13220 Encounter for screening for lipoid disorders: Secondary | ICD-10-CM | POA: Diagnosis not present

## 2022-09-13 DIAGNOSIS — D72819 Decreased white blood cell count, unspecified: Secondary | ICD-10-CM | POA: Diagnosis not present

## 2023-10-16 DIAGNOSIS — Z0000 Encounter for general adult medical examination without abnormal findings: Secondary | ICD-10-CM | POA: Diagnosis not present

## 2023-10-16 DIAGNOSIS — Z1159 Encounter for screening for other viral diseases: Secondary | ICD-10-CM | POA: Diagnosis not present

## 2023-10-16 DIAGNOSIS — Z113 Encounter for screening for infections with a predominantly sexual mode of transmission: Secondary | ICD-10-CM | POA: Diagnosis not present

## 2023-11-03 DIAGNOSIS — D72819 Decreased white blood cell count, unspecified: Secondary | ICD-10-CM | POA: Diagnosis not present

## 2024-10-18 DIAGNOSIS — Z13220 Encounter for screening for lipoid disorders: Secondary | ICD-10-CM | POA: Diagnosis not present

## 2024-10-18 DIAGNOSIS — Z0000 Encounter for general adult medical examination without abnormal findings: Secondary | ICD-10-CM | POA: Diagnosis not present

## 2024-10-18 DIAGNOSIS — Z113 Encounter for screening for infections with a predominantly sexual mode of transmission: Secondary | ICD-10-CM | POA: Diagnosis not present
